# Patient Record
Sex: Male | Born: 1990 | Race: Black or African American | Hispanic: No | Marital: Single | Smoking: Former smoker
Health system: Southern US, Community
[De-identification: ages and names within clinical notes are randomized; demographics above are authoritative.]

## PROBLEM LIST (undated history)

## (undated) HISTORY — PX: WISDOM TOOTH EXTRACTION: SHX21

---

## 2012-03-18 ENCOUNTER — Encounter (HOSPITAL_BASED_OUTPATIENT_CLINIC_OR_DEPARTMENT_OTHER): Payer: Self-pay | Admitting: Emergency Medicine

## 2012-03-18 ENCOUNTER — Emergency Department (HOSPITAL_BASED_OUTPATIENT_CLINIC_OR_DEPARTMENT_OTHER)
Admission: EM | Admit: 2012-03-18 | Discharge: 2012-03-18 | Disposition: A | Discharge status: Home / Self Care | Payer: 59 | Attending: Emergency Medicine | Admitting: Emergency Medicine

## 2012-03-18 DIAGNOSIS — L272 Dermatitis due to ingested food: Secondary | ICD-10-CM | POA: Insufficient documentation

## 2012-03-18 DIAGNOSIS — F17200 Nicotine dependence, unspecified, uncomplicated: Secondary | ICD-10-CM | POA: Insufficient documentation

## 2012-03-18 DIAGNOSIS — R21 Rash and other nonspecific skin eruption: Secondary | ICD-10-CM

## 2012-03-18 MED ORDER — PREDNISONE 20 MG PO TABS: 40.0000 mg | ORAL_TABLET | Freq: Every day | ORAL | Status: DC

## 2012-03-18 MED ORDER — HYDROXYZINE HCL 25 MG PO TABS: 25.0000 mg | ORAL_TABLET | Freq: Four times a day (QID) | ORAL | Status: DC

## 2012-03-18 MED ORDER — PREDNISONE 20 MG PO TABS
40.0000 mg | ORAL_TABLET | Freq: Once | ORAL | Status: AC
  Administered 2012-03-18: 40 mg via ORAL
  Filled 2012-03-18: qty 2

## 2012-03-18 NOTE — ED Notes (Signed)
Pt /o itchy rash all over since wed. Pt states he has been taking benadryl without relief

## 2012-03-18 NOTE — ED Provider Notes (Signed)
History     CSN: [id number]  Arrival date & time 03/18/12  0306   First MD Initiated Contact with Patient 03/18/12 0321      Chief Complaint  Patient presents with  . Rash    (Consider location/radiation/quality/duration/timing/severity/associated sxs/prior treatment) HPI Comments: Patient is an otherwise healthy 21 year old male who presents with a complaint of rash which has been present for approximately 2 and half days. He notes eating a chicken and shrimp meal followed by a yogurt and shortly thereafter developed diffuse extremity and truncal papular erythematous) acute rash. This has been persistent though it improves after taking Benadryl. It is not associated with intraoral lesions, lips or tongue, difficulty swelling breathing. He denies any topical exposures, no new medications other than Benadryl which has been using for the rash.  Patient is a 21 y.o. male presenting with rash. The history is provided by the patient.  Rash     History reviewed. No pertinent past medical history.  History reviewed. No pertinent past surgical history.  No family history on file.  History  Substance Use Topics  . Smoking status: Current Some Day Smoker  . Smokeless tobacco: Not on file  . Alcohol Use: Yes      Review of Systems  Constitutional: Negative for fever.  Skin: Positive for rash.    Allergies  Review of patient's allergies indicates no known allergies.  Home Medications   Current Outpatient Rx  Name Route Sig Dispense Refill  . DIPHENHYDRAMINE HCL (SLEEP) 25 MG PO TABS Oral Take 25 mg by mouth at bedtime as needed.    Marland Kitchen HYDROXYZINE HCL 25 MG PO TABS Oral Take 1 tablet (25 mg total) by mouth every 6 (six) hours. 12 tablet 0  . PREDNISONE 20 MG PO TABS Oral Take 2 tablets (40 mg total) by mouth daily. 10 tablet 0    BP 128/79  Pulse 89  Temp 97.8 F (36.6 C) (Oral)  Resp 18  Ht 6\' 3"  (1.905 m)  Wt 175 lb (79.379 kg)  BMI 21.87 kg/m2  SpO2  100%  Physical Exam  Nursing note and vitals reviewed. Constitutional: He appears well-developed and well-nourished. No distress.  HENT:  Head: Normocephalic and atraumatic.  Mouth/Throat: Oropharynx is clear and moist. No oropharyngeal exudate.       No swelling of the intraoral cavity, no lesions on the buccal mucosa or the tongue or lips.  Eyes: Conjunctivae normal are normal. No scleral icterus.  Musculoskeletal: Normal range of motion. He exhibits no edema and no tenderness.  Neurological: He is alert. Coordination normal.  Skin: Skin is warm and dry. Rash (diffuse papular erythematous nontender rash, no petechiae, no purpura) noted. He is not diaphoretic.    ED Course  Procedures (including critical care time)  Labs Reviewed - No data to display No results found.   1. Rash       MDM  Well-appearing, allergic type rash, no signs of systemic toxicity or anaphylaxis, prednisone given, home with hydroxyzine and prednisone, followup for allergy testing as needed.        Vida Roller, MD 03/18/12 0330

## 2014-08-09 ENCOUNTER — Emergency Department (HOSPITAL_BASED_OUTPATIENT_CLINIC_OR_DEPARTMENT_OTHER): Payer: 59

## 2014-08-09 ENCOUNTER — Emergency Department (HOSPITAL_BASED_OUTPATIENT_CLINIC_OR_DEPARTMENT_OTHER)
Admission: EM | Admit: 2014-08-09 | Discharge: 2014-08-09 | Disposition: A | Discharge status: Home / Self Care | Payer: 59 | Attending: Emergency Medicine | Admitting: Emergency Medicine

## 2014-08-09 ENCOUNTER — Encounter (HOSPITAL_BASED_OUTPATIENT_CLINIC_OR_DEPARTMENT_OTHER): Payer: Self-pay | Admitting: *Deleted

## 2014-08-09 DIAGNOSIS — Z720 Tobacco use: Secondary | ICD-10-CM | POA: Insufficient documentation

## 2014-08-09 DIAGNOSIS — Y92410 Unspecified street and highway as the place of occurrence of the external cause: Secondary | ICD-10-CM | POA: Insufficient documentation

## 2014-08-09 DIAGNOSIS — Z23 Encounter for immunization: Secondary | ICD-10-CM | POA: Insufficient documentation

## 2014-08-09 DIAGNOSIS — Z79899 Other long term (current) drug therapy: Secondary | ICD-10-CM | POA: Insufficient documentation

## 2014-08-09 DIAGNOSIS — S50311A Abrasion of right elbow, initial encounter: Secondary | ICD-10-CM | POA: Insufficient documentation

## 2014-08-09 DIAGNOSIS — S4992XA Unspecified injury of left shoulder and upper arm, initial encounter: Secondary | ICD-10-CM | POA: Insufficient documentation

## 2014-08-09 DIAGNOSIS — Y9389 Activity, other specified: Secondary | ICD-10-CM | POA: Insufficient documentation

## 2014-08-09 DIAGNOSIS — Y990 Civilian activity done for income or pay: Secondary | ICD-10-CM | POA: Insufficient documentation

## 2014-08-09 DIAGNOSIS — Z7952 Long term (current) use of systemic steroids: Secondary | ICD-10-CM | POA: Insufficient documentation

## 2014-08-09 MED ORDER — NAPROXEN 500 MG PO TABS: 500.0000 mg | ORAL_TABLET | Freq: Two times a day (BID) | ORAL | Status: DC

## 2014-08-09 MED ORDER — TETANUS-DIPHTH-ACELL PERTUSSIS 5-2.5-18.5 LF-MCG/0.5 IM SUSP
0.5000 mL | Freq: Once | INTRAMUSCULAR | Status: AC
  Administered 2014-08-09: 0.5 mL via INTRAMUSCULAR
  Filled 2014-08-09: qty 0.5

## 2014-08-09 MED ORDER — METHOCARBAMOL 500 MG PO TABS: 500.0000 mg | ORAL_TABLET | Freq: Two times a day (BID) | ORAL | Status: DC
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
## 2015-10-06 ENCOUNTER — Emergency Department (HOSPITAL_BASED_OUTPATIENT_CLINIC_OR_DEPARTMENT_OTHER)
Admission: EM | Admit: 2015-10-06 | Discharge: 2015-10-06 | Disposition: A | Discharge status: Home / Self Care | Payer: 59 | Attending: Emergency Medicine | Admitting: Emergency Medicine

## 2015-10-06 ENCOUNTER — Encounter (HOSPITAL_BASED_OUTPATIENT_CLINIC_OR_DEPARTMENT_OTHER): Payer: Self-pay | Admitting: *Deleted

## 2015-10-06 DIAGNOSIS — F17200 Nicotine dependence, unspecified, uncomplicated: Secondary | ICD-10-CM | POA: Insufficient documentation

## 2015-10-06 DIAGNOSIS — H9201 Otalgia, right ear: Secondary | ICD-10-CM | POA: Insufficient documentation

## 2015-10-06 MED ORDER — METHYLPREDNISOLONE 4 MG PO TBPK: ORAL_TABLET | ORAL | Status: DC

## 2015-10-06 MED ORDER — FLUTICASONE PROPIONATE 50 MCG/ACT NA SUSP: 1.0000 | Freq: Every day | NASAL | Status: DC

## 2015-10-06 MED FILL — SM ALLERGY RELIEF 50 MCG SP: 50 MCG | 30 days supply | Qty: 16 | Fill #0

## 2015-10-06 MED FILL — METHYLPREDNISOLONE 4 MG TAB: 4 | 6 days supply | Qty: 21 | Fill #0

## 2015-10-06 NOTE — ED Notes (Signed)
Pt amb to room 8 with quick steady gait in nad. Pt reports right ear pain x Friday, with intermittent pain in left ear also. Denies cough, sore throat or any other c/o.

## 2015-10-06 NOTE — ED Provider Notes (Signed)
CSN: [id number]     Arrival date & time 10/06/15  0757 History   First MD Initiated Contact with Patient 10/06/15 [phone]     Chief Complaint  Patient presents with  . Otalgia     (Consider location/radiation/quality/duration/timing/severity/associated sxs/prior Treatment) HPI Comments: R ear pain since Friday - throbbing, worse with wind, no pain with touching / palpation. No associated fevers. No URI sx / no itching / runny nose / sore throat.  No cough. Sx are mild, persistent, no change in hearing  Patient is a 25 y.o. male presenting with ear pain. The history is provided by the patient.  Otalgia Associated symptoms: no cough and no fever     History reviewed. No pertinent past medical history. History reviewed. No pertinent past surgical history. History reviewed. No pertinent family history. Social History  Substance Use Topics  . Smoking status: Current Some Day Smoker  . Smokeless tobacco: None  . Alcohol Use: Yes    Review of Systems  Constitutional: Negative for fever.  HENT: Positive for ear pain.   Respiratory: Negative for cough and shortness of breath.       Allergies  Review of patient's allergies indicates no known allergies.  Home Medications   Prior to Admission medications   Medication Sig Start Date End Date Taking? Authorizing Provider  fluticasone (FLONASE) 50 MCG/ACT nasal spray Place 1 spray into both nostrils daily. 10/06/15   Eber HongBrian Tae Vonada, MD  methylPREDNISolone (MEDROL DOSEPAK) 4 MG TBPK tablet 6 day taper 10/06/15   Eber HongBrian Lugenia Assefa, MD   There were no vitals taken for this visit. Physical Exam  Constitutional: He appears well-developed and well-nourished.  HENT:  Head: Normocephalic and atraumatic.  R ear - no ttp over tragus / manipulation of auricle.  TM on R with minimal erythema - light reflex present.  No effusion.  No Sinus ttp - nares patent.    No rash in the EAC.  No ttp over the mastoid.  Small spot of exudate on the left tonsil   Eyes: Conjunctivae are normal. Right eye exhibits no discharge. Left eye exhibits no discharge.  Pulmonary/Chest: Effort normal. No respiratory distress.  Lymphadenopathy:    He has no cervical adenopathy.  Neurological: He is alert. Coordination normal.  Skin: Skin is warm and dry. No rash noted. He is not diaphoretic. No erythema.  Psychiatric: He has a normal mood and affect.  Nursing note and vitals reviewed.   ED Course  Procedures (including critical care time) Labs Review Labs Reviewed - No data to display  Imaging Review No results found. I have personally reviewed and evaluated these images and lab results as part of my medical decision-making.    MDM   Final diagnoses:  Otalgia, right    Otalgia NOS   The patient has no signs of upper respiratory infection, he does not a sore throat or lymphadenopathy or fever, he may have some eustachian tube dysfunction secondary to possible early URI however the patient is totally stable appearing and can go home with steroid pack, Flonase, supportive anti-inflammatories, he is in agreement with the plan.  Eber HongBrian Alli Jasmer, MD 10/06/15 [phone]

## 2015-10-06 NOTE — Discharge Instructions (Signed)

## 2017-02-22 ENCOUNTER — Encounter (HOSPITAL_BASED_OUTPATIENT_CLINIC_OR_DEPARTMENT_OTHER): Payer: Self-pay | Admitting: *Deleted

## 2017-02-22 ENCOUNTER — Emergency Department (HOSPITAL_BASED_OUTPATIENT_CLINIC_OR_DEPARTMENT_OTHER)
Admission: EM | Admit: 2017-02-22 | Discharge: 2017-02-22 | Disposition: A | Discharge status: Home / Self Care | Payer: Managed Care, Other (non HMO) | Attending: Emergency Medicine | Admitting: Emergency Medicine

## 2017-02-22 DIAGNOSIS — Y939 Activity, unspecified: Secondary | ICD-10-CM | POA: Insufficient documentation

## 2017-02-22 DIAGNOSIS — Y929 Unspecified place or not applicable: Secondary | ICD-10-CM | POA: Insufficient documentation

## 2017-02-22 DIAGNOSIS — S39012A Strain of muscle, fascia and tendon of lower back, initial encounter: Secondary | ICD-10-CM | POA: Diagnosis not present

## 2017-02-22 DIAGNOSIS — X509XXA Other and unspecified overexertion or strenuous movements or postures, initial encounter: Secondary | ICD-10-CM | POA: Diagnosis not present

## 2017-02-22 DIAGNOSIS — Y999 Unspecified external cause status: Secondary | ICD-10-CM | POA: Diagnosis not present

## 2017-02-22 DIAGNOSIS — M545 Low back pain: Secondary | ICD-10-CM | POA: Diagnosis present

## 2017-02-22 MED ORDER — IBUPROFEN 800 MG PO TABS: 800.0000 mg | ORAL_TABLET | Freq: Three times a day (TID) | ORAL | 0 refills | Status: AC | PRN

## 2017-02-22 MED FILL — IBUPROFEN 800 MG TABS: 800 | 7 days supply | Qty: 21 | Fill #0

## 2017-02-22 NOTE — Discharge Instructions (Signed)
You have been seen in the Emergency Department (ED)  today for back pain.  Your workup and exam have not shown any acute abnormalities and you are likely suffering from muscle strain or possible problems with your discs, but there is no treatment that will fix your symptoms at this time.  Please take Motrin (ibuprofen) as needed for your pain according to the instructions written on the box.    Please follow up with your doctor as soon as possible regarding today's ED visit and your back pain.  Return to the ED for worsening back pain, fever, weakness or numbness of either leg, or if you develop either (1) an inability to urinate or have bowel movements, or (2) loss of your ability to control your bathroom functions (if you start having "accidents"), or if you develop other new symptoms that concern you.  

## 2017-02-22 NOTE — ED Notes (Signed)
Pt given work note and directed to pharmacy to pick up Rx

## 2017-02-22 NOTE — ED Triage Notes (Signed)
Pt reports low back pain x 2-3 days. Works for [employer]. 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

## 2017-02-22 NOTE — ED Provider Notes (Signed)
Emergency Department Provider Note   I have reviewed the triage vital signs and the nursing notes.   HISTORY  Chief Complaint Back Pain   HPI Tristan Washington is a 26 y.o. male presents to the emergency room in for evaluation of lower back discomfort in the setting of working more at his job. Patient states he's been doing over time and lifting heavy boxes at work. He is had more mild episodes of similar pain in the past. No fevers or chills. No numbness or tingling in the legs. No midline back pain. No bowel or bladder incontinence. No symptoms of urinary retention. No injury to the back. He has not taken any over-the-counter medications today. No radiation of symptoms. Pain worse with movement. Currently rated at mild to moderate.    History reviewed. No pertinent past medical history.  There are no active problems to display for this patient.   Past Surgical History:  Procedure Laterality Date  . WISDOM TOOTH EXTRACTION      Current Outpatient Rx  . Order #: [id number] Class: Print  . Order #: [id number] Class: Print  . Order #: [id number] Class: Print    Allergies Patient has no known allergies.  No family history on file.  Social History Social History  Substance Use Topics  . Smoking status: Never Smoker  . Smokeless tobacco: Never Used  . Alcohol use Yes     Comment: social/weekends    Review of Systems  Constitutional: No fever/chills Eyes: No visual changes. ENT: No sore throat. Cardiovascular: Denies chest pain. Respiratory: Denies shortness of breath. Gastrointestinal: No abdominal pain.  No nausea, no vomiting.  No diarrhea.  No constipation. Genitourinary: Negative for dysuria. Musculoskeletal: Positive for back pain. Skin: Negative for rash. Neurological: Negative for headaches, focal weakness or numbness.  10-point ROS otherwise negative.  ____________________________________________   PHYSICAL EXAM:  VITAL SIGNS: ED Triage Vitals  Enc Vitals  Group     BP 02/22/17 0819 128/81     Pulse Rate 02/22/17 0819 83     Resp 02/22/17 0819 18     Temp 02/22/17 0819 98 F (36.7 C)     Temp Source 02/22/17 0819 Oral     SpO2 02/22/17 0819 100 %     Weight 02/22/17 0819 185 lb (83.9 kg)     Height 02/22/17 0819  (1.905 m)     Pain Score 02/22/17 0828 2    Constitutional: Alert and oriented. Well appearing and in no acute distress. Eyes: Conjunctivae are normal.  Head: Atraumatic. Nose: No congestion/rhinnorhea. Mouth/Throat: Mucous membranes are moist.  Oropharynx non-erythematous. Neck: No stridor.   Cardiovascular: Normal rate, regular rhythm. Good peripheral circulation. Grossly normal heart sounds.   Respiratory: Normal respiratory effort.  No retractions. Lungs CTAB. Gastrointestinal: Soft and nontender. No distention.  Musculoskeletal: No lower extremity tenderness nor edema. No gross deformities of extremities. No midline spine tenderness.  Neurologic:  Normal speech and language. No gross focal neurologic deficits are appreciated. Normal strength and sensation in the LEs.  Skin:  Skin is warm, dry and intact. No rash noted.  ____________________________________________   PROCEDURES  Procedure(s) performed:   Procedures  None ____________________________________________   INITIAL IMPRESSION / ASSESSMENT AND PLAN / ED COURSE  Pertinent labs & imaging results that were available during my care of the patient were reviewed by me and considered in my medical decision making (see chart for details).  Patient presents to the ED for evaluation of lower back pain. No midline spine tenderness or  evidence of spine emergency. No red flag symptoms. Most consistent with msk strain. Plan for decreased lifting at work, NSAIDs, and PCP follow up PRN. No emergent spine imaging at this time.   At this time, I do not feel there is any life-threatening condition present. I have reviewed and discussed all results (EKG, imaging,  lab, urine as appropriate), exam findings with patient. I have reviewed nursing notes and appropriate previous records.  I feel the patient is safe to be discharged home without further emergent workup. Discussed usual and customary return precautions. Patient and family (if present) verbalize understanding and are comfortable with this plan.  Patient will follow-up with their primary care provider. If they do not have a primary care provider, information for follow-up has been provided to them. All questions have been answered.    ____________________________________________  FINAL CLINICAL IMPRESSION(S) / ED DIAGNOSES  Final diagnoses:  Strain of lumbar region, initial encounter     MEDICATIONS GIVEN DURING THIS VISIT:  Medications - No data to display   NEW OUTPATIENT MEDICATIONS STARTED DURING THIS VISIT:  Discharge Medication List as of 02/22/2017  8:42 AM    START taking these medications   Details  ibuprofen (ADVIL,MOTRIN) 800 MG tablet Take 1 tablet (800 mg total) by mouth every 8 (eight) hours as needed., Starting Tue 02/22/2017, Print        Note:  This document was prepared using Dragon voice recognition software and may include unintentional dictation errors.  Alona Bene, MD Emergency Medicine    Long, Arlyss Repress, MD 02/23/17 [phone]

## 2017-03-10 ENCOUNTER — Encounter (HOSPITAL_BASED_OUTPATIENT_CLINIC_OR_DEPARTMENT_OTHER): Payer: Self-pay

## 2017-03-10 ENCOUNTER — Emergency Department (HOSPITAL_BASED_OUTPATIENT_CLINIC_OR_DEPARTMENT_OTHER): Payer: Managed Care, Other (non HMO)

## 2017-03-10 ENCOUNTER — Emergency Department (HOSPITAL_BASED_OUTPATIENT_CLINIC_OR_DEPARTMENT_OTHER)
Admission: EM | Admit: 2017-03-10 | Discharge: 2017-03-10 | Disposition: A | Discharge status: Home / Self Care | Payer: Managed Care, Other (non HMO) | Attending: Emergency Medicine | Admitting: Emergency Medicine

## 2017-03-10 DIAGNOSIS — M549 Dorsalgia, unspecified: Secondary | ICD-10-CM | POA: Insufficient documentation

## 2017-03-10 DIAGNOSIS — M545 Low back pain: Secondary | ICD-10-CM | POA: Diagnosis present

## 2017-03-10 DIAGNOSIS — Z79899 Other long term (current) drug therapy: Secondary | ICD-10-CM | POA: Insufficient documentation

## 2017-03-10 MED ORDER — METAXALONE 800 MG PO TABS: 800.0000 mg | ORAL_TABLET | Freq: Three times a day (TID) | ORAL | 0 refills | Status: DC

## 2017-03-10 MED ORDER — MELOXICAM 15 MG PO TABS: 15.0000 mg | ORAL_TABLET | Freq: Every day | ORAL | 0 refills | Status: DC

## 2017-03-10 MED ORDER — NAPROXEN 250 MG PO TABS
500.0000 mg | ORAL_TABLET | Freq: Once | ORAL | Status: AC
  Administered 2017-03-10: 500 mg via ORAL
  Filled 2017-03-10: qty 2

## 2017-03-10 MED ORDER — PREDNISONE 50 MG PO TABS
60.0000 mg | ORAL_TABLET | Freq: Once | ORAL | Status: AC
  Administered 2017-03-10: 60 mg via ORAL
  Filled 2017-03-10: qty 1

## 2017-03-10 NOTE — ED Provider Notes (Addendum)
[facility] Provider [facility]   CSN: [id number] Arrival date & time: 03/10/17  0413     History   Chief Complaint Chief Complaint  Patient presents with  . Back Pain    HPI Tristan Washington is a 26 y.o. male.  The history is provided by the patient.  Back Pain   This is a chronic problem. The current episode started more than 1 week ago. The problem occurs constantly. The problem has not changed since onset.Associated with: lifts repeatedly but low weight. The pain is present in the sacro-iliac joint and lumbar spine. The pain does not radiate. The pain is at a severity of 4/10. The pain is moderate. The symptoms are aggravated by bending, twisting and certain positions. The pain is the same all the time. Pertinent negatives include no chest pain, no fever, no numbness, no weight loss, no headaches, no abdominal pain, no abdominal swelling, no bowel incontinence, no perianal numbness, no bladder incontinence, no dysuria, no pelvic pain, no leg pain, no paresthesias, no paresis, no tingling and no weakness. He has tried NSAIDs and heat for the symptoms. The treatment provided mild relief. Risk factors: none.    History reviewed. No pertinent past medical history.  There are no active problems to display for this patient.   Past Surgical History:  Procedure Laterality Date  . WISDOM TOOTH EXTRACTION         Home Medications    Prior to Admission medications   Medication Sig Start Date End Date Taking? Authorizing Provider  fluticasone (FLONASE) 50 MCG/ACT nasal spray Place 1 spray into both nostrils daily. 10/06/15   Eber Hong, MD  ibuprofen (ADVIL,MOTRIN) 800 MG tablet Take 1 tablet (800 mg total) by mouth every 8 (eight) hours as needed. 02/22/17   LongArlyss Repress, MD  methylPREDNISolone (MEDROL DOSEPAK) 4 MG TBPK tablet 6 day taper 10/06/15   Eber Hong, MD    Family History No family history on file.  Social History Social History  Substance Use Topics  .  Smoking status: Never Smoker  . Smokeless tobacco: Never Used  . Alcohol use Yes     Comment: social/weekends     Allergies   Patient has no known allergies.   Review of Systems Review of Systems  Constitutional: Negative for fever and weight loss.  Respiratory: Negative for shortness of breath.   Cardiovascular: Negative for chest pain.  Gastrointestinal: Negative for abdominal pain and bowel incontinence.  Genitourinary: Negative for bladder incontinence, difficulty urinating, dysuria and pelvic pain.  Musculoskeletal: Positive for back pain. Negative for gait problem.  Neurological: Negative for tingling, weakness, numbness, headaches and paresthesias.  All other systems reviewed and are negative.    Physical Exam Updated Vital Signs BP 138/81 (BP Location: Right Arm)   Pulse 77   Temp 97.8 F (36.6 C) (Oral)   Resp 18   Ht  (1.905 m)   Wt 83.9 kg (185 lb)   SpO2 100%   BMI 23.12 kg/m   Physical Exam  Constitutional: He is oriented to person, place, and time. He appears well-developed and well-nourished. No distress.  HENT:  Head: Normocephalic and atraumatic.  Mouth/Throat: No oropharyngeal exudate.  Eyes: Pupils are equal, round, and reactive to light. Conjunctivae are normal.  Neck: Normal range of motion. Neck supple.  Cardiovascular: Normal rate, regular rhythm, normal heart sounds and intact distal pulses.   Pulmonary/Chest: Effort normal and breath sounds normal.  Abdominal: Soft. Bowel sounds are normal. He exhibits no  mass. There is no tenderness. There is no rebound and no guarding.  Musculoskeletal: Normal range of motion.  Neurological: He is alert and oriented to person, place, and time. He displays normal reflexes.  Skin: Skin is warm and dry. Capillary refill takes less than 2 seconds.  Psychiatric: He has a normal mood and affect.     ED Treatments / Results   Vitals:   03/10/17 0420  BP: 138/81  Pulse: 77  Resp: 18  Temp: 97.8 F  (36.6 C)  SpO2: 100%    Radiology Dg Lumbar Spine Complete  Result Date: 03/10/2017 CLINICAL DATA:  Chronic intermittent lower back pain. Initial encounter. EXAM: LUMBAR SPINE - COMPLETE 4+ VIEW COMPARISON:  None. FINDINGS: There is no evidence of fracture or subluxation. Vertebral bodies demonstrate normal height and alignment. Intervertebral disc spaces are preserved. The visualized neural foramina are grossly unremarkable in appearance. The visualized bowel gas pattern is unremarkable in appearance; air and stool are noted within the colon. The sacroiliac joints are within normal limits. IMPRESSION: No evidence of fracture or subluxation along the lumbar spine. Electronically Signed   By: Roanna Raider M.D.   On: 03/10/2017 05:19    Procedures Procedures (including critical care time)  Medications Ordered in ED Medications  predniSONE (DELTASONE) tablet 60 mg (not administered)  naproxen (NAPROSYN) tablet 500 mg (not administered)       Final Clinical Impressions(s) / ED Diagnoses   Final diagnoses:  Mechanical back pain  Will refer to Dr. Pearletha Forge, will add muscle relaxants.  Strict return precautions given for  Shortness of breath, swelling or the lips or tongue, chest pain, dyspnea on exertion, new weakness or numbness changes in vision or speech,  Inability to tolerate liquids or food, changes in voice cough, altered mental status or any concerns. No signs of systemic illness or infection. The patient is nontoxic-appearing on exam and vital signs are within normal limits.    I have reviewed the triage vital signs and the nursing notes. Pertinent labs &imaging results that were available during my care of the patient were reviewed by me and considered in my medical decision making (see chart for details).  After history, exam, and medical workup I feel the patient has been appropriately medically screened and is safe for discharge home. Pertinent diagnoses were discussed  with the patient. Patient was given return precautions.   New Prescriptions New Prescriptions   No medications on file     Leeam Cedrone, MD 03/10/17 0540    Yuma Pacella, MD 03/10/17 1610

## 2017-03-10 NOTE — ED Triage Notes (Signed)
Pt c/o chronic lower back pain, did not follow up from visit on 9/25.  Pt denies groin tingling or numbness, denies incontinence.

## 2017-03-10 NOTE — ED Notes (Signed)
Pt verbalizes understanding of d/c instructions and denies any further needs at this time. 

## 2017-04-08 ENCOUNTER — Encounter (HOSPITAL_BASED_OUTPATIENT_CLINIC_OR_DEPARTMENT_OTHER): Payer: Self-pay | Admitting: Emergency Medicine

## 2017-04-08 ENCOUNTER — Emergency Department (HOSPITAL_BASED_OUTPATIENT_CLINIC_OR_DEPARTMENT_OTHER)
Admission: EM | Admit: 2017-04-08 | Discharge: 2017-04-09 | Disposition: A | Discharge status: Home / Self Care | Payer: Managed Care, Other (non HMO) | Attending: Emergency Medicine | Admitting: Emergency Medicine

## 2017-04-08 ENCOUNTER — Other Ambulatory Visit: Payer: Self-pay

## 2017-04-08 DIAGNOSIS — Z87891 Personal history of nicotine dependence: Secondary | ICD-10-CM | POA: Diagnosis not present

## 2017-04-08 DIAGNOSIS — M79641 Pain in right hand: Secondary | ICD-10-CM | POA: Diagnosis present

## 2017-04-08 DIAGNOSIS — Z79899 Other long term (current) drug therapy: Secondary | ICD-10-CM | POA: Insufficient documentation

## 2017-04-08 NOTE — ED Triage Notes (Signed)
Pt fell onto right hand 2 weeks ago. Still painful and slightly swollen on ulnar side.

## 2017-04-09 ENCOUNTER — Encounter (HOSPITAL_BASED_OUTPATIENT_CLINIC_OR_DEPARTMENT_OTHER): Payer: Self-pay | Admitting: Emergency Medicine

## 2017-04-09 ENCOUNTER — Emergency Department (HOSPITAL_BASED_OUTPATIENT_CLINIC_OR_DEPARTMENT_OTHER): Payer: Managed Care, Other (non HMO)

## 2017-04-09 MED ORDER — NAPROXEN 250 MG PO TABS
500.0000 mg | ORAL_TABLET | Freq: Once | ORAL | Status: AC
  Administered 2017-04-09: 500 mg via ORAL
  Filled 2017-04-09: qty 2

## 2017-04-09 MED ORDER — ACETAMINOPHEN 500 MG PO TABS
1000.0000 mg | ORAL_TABLET | Freq: Once | ORAL | Status: AC
Start: 2017-04-09 — End: 2017-04-09
  Administered 2017-04-09: 1000 mg via ORAL
  Filled 2017-04-09: qty 2

## 2017-04-09 MED ORDER — NAPROXEN 375 MG PO TABS: 375.0000 mg | ORAL_TABLET | Freq: Two times a day (BID) | ORAL | 0 refills | Status: DC

## 2017-04-09 NOTE — ED Notes (Signed)
Patient transported to X-ray 

## 2017-04-09 NOTE — ED Provider Notes (Signed)
[facility] EMERGENCY DEPARTMENT Provider Note   CSN: [id number] Arrival date & time: 04/08/17  2347     History   Chief Complaint Chief Complaint  Patient presents with  . Hand Injury    HPI Tristan Washington is a 26 y.o. male.  The history is provided by the patient.  Hand Injury   The incident occurred more than 1 week ago (fell 2-3 weeks ago). The incident occurred at work. The injury mechanism was a fall. The pain is present in the right hand. The quality of the pain is described as aching. The pain is moderate. The pain has been constant since the incident. Pertinent negatives include no fever. He reports no foreign bodies present. The symptoms are aggravated by movement and palpation. He has tried NSAIDs for the symptoms. The treatment provided no relief.    History reviewed. No pertinent past medical history.  There are no active problems to display for this patient.   Past Surgical History:  Procedure Laterality Date  . WISDOM TOOTH EXTRACTION         Home Medications    Prior to Admission medications   Medication Sig Start Date End Date Taking? Authorizing Provider  fluticasone (FLONASE) 50 MCG/ACT nasal spray Place 1 spray into both nostrils daily. 10/06/15   Eber HongMiller, Brian, MD  ibuprofen (ADVIL,MOTRIN) 800 MG tablet Take 1 tablet (800 mg total) by mouth every 8 (eight) hours as needed. 02/22/17   Long, Arlyss RepressJoshua G, MD  meloxicam (MOBIC) 15 MG tablet Take 1 tablet (15 mg total) by mouth daily. 03/10/17   Tifini Reeder, MD  metaxalone (SKELAXIN) 800 MG tablet Take 1 tablet (800 mg total) by mouth 3 (three) times daily. 03/10/17   Nitesh Pitstick, MD  methylPREDNISolone (MEDROL DOSEPAK) 4 MG TBPK tablet 6 day taper 10/06/15   Eber HongMiller, Brian, MD    Family History No family history on file.  Social History Social History   Tobacco Use  . Smoking status: Former Games developermoker  . Smokeless tobacco: Never Used  Substance Use Topics  . Alcohol use: Yes    Comment:  social/weekends  . Drug use: Yes    Types: Marijuana     Allergies   Patient has no known allergies.   Review of Systems Review of Systems  Constitutional: Negative for fever.  Musculoskeletal: Positive for arthralgias. Negative for gait problem, joint swelling and neck pain.  All other systems reviewed and are negative.    Physical Exam Updated Vital Signs BP 132/85 (BP Location: Right Arm)   Pulse 98   Temp 97.6 F (36.4 C) (Oral)   Resp 16   Ht 6\' 3"  (1.905 m)   Wt 83.9 kg (185 lb)   SpO2 100%   BMI 23.12 kg/m   Physical Exam  Constitutional: He is oriented to person, place, and time. He appears well-developed and well-nourished. No distress.  HENT:  Head: Normocephalic and atraumatic.  Mouth/Throat: No oropharyngeal exudate.  Eyes: Conjunctivae are normal. Pupils are equal, round, and reactive to light.  Neck: Normal range of motion. Neck supple.  Cardiovascular: Normal rate, regular rhythm, normal heart sounds and intact distal pulses.  Pulmonary/Chest: Effort normal and breath sounds normal. No stridor. He has no wheezes. He has no rales.  Abdominal: Soft. Bowel sounds are normal. He exhibits no mass. There is no tenderness. There is no rebound and no guarding.  Musculoskeletal: Normal range of motion. He exhibits no edema or deformity.       Right elbow: Normal.  Right wrist: Normal.       Right forearm: Normal.       Right hand: Normal. He exhibits normal range of motion, no bony tenderness, normal two-point discrimination, normal capillary refill, no deformity and no swelling. Normal sensation noted. Normal strength noted.  Neurological: He is alert and oriented to person, place, and time. He displays normal reflexes.  Skin: Skin is warm and dry. Capillary refill takes less than 2 seconds.  Nursing note and vitals reviewed.    ED Treatments / Results   Vitals:   04/08/17 2353  BP: 132/85  Pulse: 98  Resp: 16  Temp: 97.6 F (36.4 C)  SpO2: 100%     Radiology No results found.  Procedures Procedures (including critical care time)  Medications Ordered in ED Medications  naproxen (NAPROSYN) tablet 500 mg (500 mg Oral Given 04/09/17 0039)  acetaminophen (TYLENOL) tablet 1,000 mg (1,000 mg Oral Given 04/09/17 0039)      Final Clinical Impressions(s) / ED Diagnoses  All questions answered to the patient's satisfaction.   Strict return precautions for swelling or the lips or tongue, chest pain, dyspnea on exertion, new weakness or numbness changes in vision or speech, fevers, weakness persistent pain, Inability to tolerate liquids or food, changes in voice cough, altered mental status or any concerns. No signs of systemic illness or infection. The patient is nontoxic-appearing on exam and vital signs are within normal limits.    I have reviewed the triage vital signs and the nursing notes. Pertinent labs &imaging results that were available during my care of the patient were reviewed by me and considered in my medical decision making (see chart for details).  After history, exam, and medical workup I feel the patient has been appropriately medically screened and is safe for discharge home. Pertinent diagnoses were discussed with the patient. Patient was given return precautions.   Param Capri, MD 04/09/17 [id number]

## 2017-11-29 ENCOUNTER — Emergency Department (HOSPITAL_BASED_OUTPATIENT_CLINIC_OR_DEPARTMENT_OTHER)
Admission: EM | Admit: 2017-11-29 | Discharge: 2017-11-30 | Disposition: A | Discharge status: Home / Self Care | Payer: Managed Care, Other (non HMO) | Attending: Emergency Medicine | Admitting: Emergency Medicine

## 2017-11-29 ENCOUNTER — Encounter (HOSPITAL_BASED_OUTPATIENT_CLINIC_OR_DEPARTMENT_OTHER): Payer: Self-pay | Admitting: Emergency Medicine

## 2017-11-29 ENCOUNTER — Other Ambulatory Visit: Payer: Self-pay

## 2017-11-29 DIAGNOSIS — F1210 Cannabis abuse, uncomplicated: Secondary | ICD-10-CM | POA: Insufficient documentation

## 2017-11-29 DIAGNOSIS — Z87891 Personal history of nicotine dependence: Secondary | ICD-10-CM | POA: Insufficient documentation

## 2017-11-29 DIAGNOSIS — R197 Diarrhea, unspecified: Secondary | ICD-10-CM | POA: Diagnosis not present

## 2017-11-29 DIAGNOSIS — R112 Nausea with vomiting, unspecified: Secondary | ICD-10-CM | POA: Diagnosis not present

## 2017-11-29 DIAGNOSIS — Z79899 Other long term (current) drug therapy: Secondary | ICD-10-CM | POA: Diagnosis not present

## 2017-11-29 NOTE — ED Triage Notes (Signed)
Pt is c/o vomiting that started earlier today   Pt denies pain  States he has had a little diarrhea

## 2017-11-30 ENCOUNTER — Encounter (HOSPITAL_BASED_OUTPATIENT_CLINIC_OR_DEPARTMENT_OTHER): Payer: Self-pay | Admitting: Emergency Medicine

## 2017-11-30 MED ORDER — ONDANSETRON 8 MG PO TBDP
ORAL_TABLET | ORAL | Status: AC
  Filled 2017-11-30: qty 1

## 2017-11-30 MED ORDER — ONDANSETRON 8 MG PO TBDP
8.0000 mg | ORAL_TABLET | Freq: Once | ORAL | Status: AC
  Administered 2017-11-30: 8 mg via ORAL

## 2017-11-30 NOTE — ED Provider Notes (Signed)
[facility] EMERGENCY DEPARTMENT Provider Note   CSN: [id number] Arrival date & time: 11/29/17  2253     History   Chief Complaint Chief Complaint  Patient presents with  . Emesis    HPI Tristan Washington is a 27 y.o. male.  The history is provided by the patient.  Emesis   This is a new problem. The current episode started 12 to 24 hours ago. The problem occurs 2 to 4 times per day. The problem has not changed since onset.The emesis has an appearance of stomach contents. There has been no fever. Associated symptoms include diarrhea. Pertinent negatives include no abdominal pain, no fever, no myalgias and no URI. Risk factors include ill contacts.  Coworkers with same, 3 episodes of emesis 2 diarrhea.  Nothing since 7 pm.    History reviewed. No pertinent past medical history.  There are no active problems to display for this patient.   Past Surgical History:  Procedure Laterality Date  . WISDOM TOOTH EXTRACTION          Home Medications    Prior to Admission medications   Medication Sig Start Date End Date Taking? Authorizing Provider  fluticasone (FLONASE) 50 MCG/ACT nasal spray Place 1 spray into both nostrils daily. 10/06/15   Eber Hong, MD  ibuprofen (ADVIL,MOTRIN) 800 MG tablet Take 1 tablet (800 mg total) by mouth every 8 (eight) hours as needed. 02/22/17   Long, Arlyss Repress, MD  meloxicam (MOBIC) 15 MG tablet Take 1 tablet (15 mg total) by mouth daily. 03/10/17   Samanatha Brammer, MD  metaxalone (SKELAXIN) 800 MG tablet Take 1 tablet (800 mg total) by mouth 3 (three) times daily. 03/10/17   Valine Drozdowski, MD  methylPREDNISolone (MEDROL DOSEPAK) 4 MG TBPK tablet 6 day taper 10/06/15   Eber Hong, MD  naproxen (NAPROSYN) 375 MG tablet Take 1 tablet (375 mg total) 2 (two) times daily by mouth. 04/09/17   Conlee Sliter, MD    Family History Family History  Problem Relation Age of Onset  . Hypertension Other     Social History Social History   Tobacco  Use  . Smoking status: Former Games developer  . Smokeless tobacco: Never Used  Substance Use Topics  . Alcohol use: Yes    Comment: social/weekends  . Drug use: Yes    Types: Marijuana     Allergies   Patient has no known allergies.   Review of Systems Review of Systems  Constitutional: Negative for diaphoresis and fever.  Respiratory: Negative for shortness of breath.   Cardiovascular: Negative for chest pain.  Gastrointestinal: Positive for diarrhea, nausea and vomiting. Negative for abdominal pain.  Musculoskeletal: Negative for myalgias.  All other systems reviewed and are negative.    Physical Exam Updated Vital Signs BP 117/81 (BP Location: Left Arm)   Pulse 90   Temp 98.3 F (36.8 C) (Oral)   Resp 20   SpO2 100%   Physical Exam  Constitutional: He is oriented to person, place, and time. He appears well-developed and well-nourished. No distress.  HENT:  Head: Normocephalic and atraumatic.  Mouth/Throat: No oropharyngeal exudate.  Eyes: Pupils are equal, round, and reactive to light. Conjunctivae are normal.  Neck: Normal range of motion. Neck supple.  Cardiovascular: Normal rate, regular rhythm, normal heart sounds and intact distal pulses.  Pulmonary/Chest: Effort normal and breath sounds normal. No respiratory distress.  Abdominal: Soft. Bowel sounds are normal. He exhibits no distension and no mass. There is no tenderness. There is no  rebound and no guarding. No hernia.  Musculoskeletal: Normal range of motion.  Neurological: He is alert and oriented to person, place, and time.  Skin: Skin is warm and dry. Capillary refill takes less than 2 seconds.  Psychiatric: He has a normal mood and affect.  Nursing note and vitals reviewed.    ED Treatments / Results  Labs (all labs ordered are listed, but only abnormal results are displayed) Labs Reviewed - No data to display  EKG None  Radiology No results found.  Procedures Procedures (including critical care  time)  Medications Ordered in ED Medications  ondansetron (ZOFRAN-ODT) disintegrating tablet 8 mg (8 mg Oral Given 11/30/17 0001)      Final Clinical Impressions(s) / ED Diagnoses   Final diagnoses:  Nausea vomiting and diarrhea    Well appearing exam and vitals are benign and reassuring.    Return for pain, numbness, changes in vision or speech, fevers >100.4 unrelieved by medication, shortness of breath, intractable vomiting, or diarrhea, abdominal pain, Inability to tolerate liquids or food, cough, altered mental status or any concerns. No signs of systemic illness or infection. The patient is nontoxic-appearing on exam and vital signs are within normal limits. Will refer to urology for microscopy hematuria as patient is asymptomatic.  I have reviewed the triage vital signs and the nursing notes. Pertinent labs &imaging results that were available during my care of the patient were reviewed by me and considered in my medical decision making (see chart for details).  After history, exam, and medical workup I feel the patient has been appropriately medically screened and is safe for discharge home. Pertinent diagnoses were discussed with the patient. Patient was given return precautions.   Lycan Davee, MD 11/30/17 [phone]

## 2017-12-13 ENCOUNTER — Encounter (HOSPITAL_BASED_OUTPATIENT_CLINIC_OR_DEPARTMENT_OTHER): Payer: Self-pay | Admitting: *Deleted

## 2017-12-13 ENCOUNTER — Other Ambulatory Visit: Payer: Self-pay

## 2017-12-13 ENCOUNTER — Emergency Department (HOSPITAL_BASED_OUTPATIENT_CLINIC_OR_DEPARTMENT_OTHER)
Admission: EM | Admit: 2017-12-13 | Discharge: 2017-12-13 | Disposition: A | Discharge status: Home / Self Care | Payer: Managed Care, Other (non HMO) | Attending: Emergency Medicine | Admitting: Emergency Medicine

## 2017-12-13 DIAGNOSIS — Z87891 Personal history of nicotine dependence: Secondary | ICD-10-CM | POA: Diagnosis not present

## 2017-12-13 DIAGNOSIS — Z79899 Other long term (current) drug therapy: Secondary | ICD-10-CM | POA: Diagnosis not present

## 2017-12-13 DIAGNOSIS — H9202 Otalgia, left ear: Secondary | ICD-10-CM | POA: Insufficient documentation

## 2017-12-13 MED ORDER — NEOMYCIN-POLYMYXIN-HC 3.5-10000-1 OT SUSP: 4.0000 [drp] | Freq: Four times a day (QID) | OTIC | 0 refills | Status: AC

## 2017-12-13 NOTE — Discharge Instructions (Signed)
Apply the eardrops to the affected ear 4 times a day for 7 days.  Antiinflammatory medications: Take 600 mg of ibuprofen every 6 hours or 440 mg (over the counter dose) to 500 mg (prescription dose) of naproxen every 12 hours for the next 3 days. After this time, these medications may be used as needed for pain. Take these medications with food to avoid upset stomach. Choose only one of these medications, do not take them together. Tylenol: Should you continue to have additional pain while taking the ibuprofen or naproxen, you may add in tylenol as needed. Your daily total maximum amount of tylenol from all sources should be limited to 4000mg /day for persons without liver problems, or 2000mg /day for those with liver problems.  Follow-up with a primary care provider for any further management of this issue.

## 2017-12-13 NOTE — ED Triage Notes (Signed)
Left ear pain x 2 days

## 2017-12-13 NOTE — ED Provider Notes (Signed)
[facility] EMERGENCY DEPARTMENT Provider Note   CSN: [id number] Arrival date & time: 12/13/17  1942     History   Chief Complaint Chief Complaint  Patient presents with  . Otalgia    HPI Tristan Washington is a 27 y.o. male.  HPI   Tristan Washington is a 27 y.o. male, patient with no pertinent past medical history, presenting to the ED with left ear pain for the past few days.  Pain is aching, moderate, nonradiating.  Denies fever, ear drainage, headache, dental pain, sore throat, dizziness, hearing impairment, or any other complaints.      History reviewed. No pertinent past medical history.  There are no active problems to display for this patient.   Past Surgical History:  Procedure Laterality Date  . WISDOM TOOTH EXTRACTION          Home Medications    Prior to Admission medications   Medication Sig Start Date End Date Taking? Authorizing Provider  fluticasone (FLONASE) 50 MCG/ACT nasal spray Place 1 spray into both nostrils daily. 10/06/15   Eber HongMiller, Brian, MD  ibuprofen (ADVIL,MOTRIN) 800 MG tablet Take 1 tablet (800 mg total) by mouth every 8 (eight) hours as needed. 02/22/17   Long, Arlyss RepressJoshua G, MD  meloxicam (MOBIC) 15 MG tablet Take 1 tablet (15 mg total) by mouth daily. 03/10/17   Palumbo, April, MD  metaxalone (SKELAXIN) 800 MG tablet Take 1 tablet (800 mg total) by mouth 3 (three) times daily. 03/10/17   Palumbo, April, MD  methylPREDNISolone (MEDROL DOSEPAK) 4 MG TBPK tablet 6 day taper 10/06/15   Eber HongMiller, Brian, MD  naproxen (NAPROSYN) 375 MG tablet Take 1 tablet (375 mg total) 2 (two) times daily by mouth. 04/09/17   Palumbo, April, MD  neomycin-polymyxin-hydrocortisone (CORTISPORIN) 3.5-10000-1 OTIC suspension Place 4 drops into the left ear 4 (four) times daily for 7 days. 12/13/17 12/20/17  Anselm PancoastJoy, Ry Moody C, PA-C    Family History Family History  Problem Relation Age of Onset  . Hypertension Other     Social History Social History   Tobacco Use  .  Smoking status: Former Games developermoker  . Smokeless tobacco: Never Used  Substance Use Topics  . Alcohol use: Yes    Comment: social/weekends  . Drug use: Yes    Types: Marijuana     Allergies   Patient has no known allergies.   Review of Systems Review of Systems  Constitutional: Negative for chills and fever.  HENT: Positive for ear pain. Negative for congestion, dental problem, ear discharge and sore throat.   Gastrointestinal: Negative for nausea and vomiting.  Neurological: Negative for dizziness and headaches.     Physical Exam Updated Vital Signs BP 115/81   Pulse 87   Temp 98.4 F (36.9 C) (Oral)   Resp 16   Ht 6\' 3"  (1.905 m)   Wt 83.9 kg (185 lb)   SpO2 100%   BMI 23.12 kg/m   Physical Exam  Constitutional: He appears well-developed and well-nourished. No distress.  HENT:  Head: Normocephalic and atraumatic.  Right Ear: Tympanic membrane, external ear and ear canal normal.  Left Ear: Tympanic membrane and external ear normal.  Mouth/Throat: Oropharynx is clear and moist.  Erythematous canal  Eyes: Conjunctivae are normal.  Neck: Neck supple.  Cardiovascular: Normal rate and regular rhythm.  Pulmonary/Chest: Effort normal.  Neurological: He is alert.  Skin: Skin is warm and dry. He is not diaphoretic. No pallor.  Psychiatric: He has a normal mood and affect. His behavior  is normal.  Nursing note and vitals reviewed.    ED Treatments / Results  Labs (all labs ordered are listed, but only abnormal results are displayed) Labs Reviewed - No data to display  EKG None  Radiology No results found.  Procedures Procedures (including critical care time)  Medications Ordered in ED Medications - No data to display   Initial Impression / Assessment and Plan / ED Course  I have reviewed the triage vital signs and the nursing notes.  Pertinent labs & imaging results that were available during my care of the patient were reviewed by me and considered in my  medical decision making (see chart for details).     Patient presents with left ear pain.  Erythematous canal.  No drainage. The patient was given instructions for home care as well as return precautions. Patient voices understanding of these instructions, accepts the plan, and is comfortable with discharge.   Final Clinical Impressions(s) / ED Diagnoses   Final diagnoses:  Left ear pain    ED Discharge Orders        Ordered    neomycin-polymyxin-hydrocortisone (CORTISPORIN) 3.5-10000-1 OTIC suspension  4 times daily     12/13/17 2011       Concepcion Living 12/13/17 2012    Tegeler, Canary Brim, MD 12/13/17 2312

## 2019-04-27 IMAGING — CR DG HAND COMPLETE 3+V*R*
3 series · 3 of 3 positions shown · non-contrast
Comparison: None.

CLINICAL DATA: Fall 3 weeks ago

EXAM:
RIGHT HAND - COMPLETE 3+ VIEW

[x hand pa right]
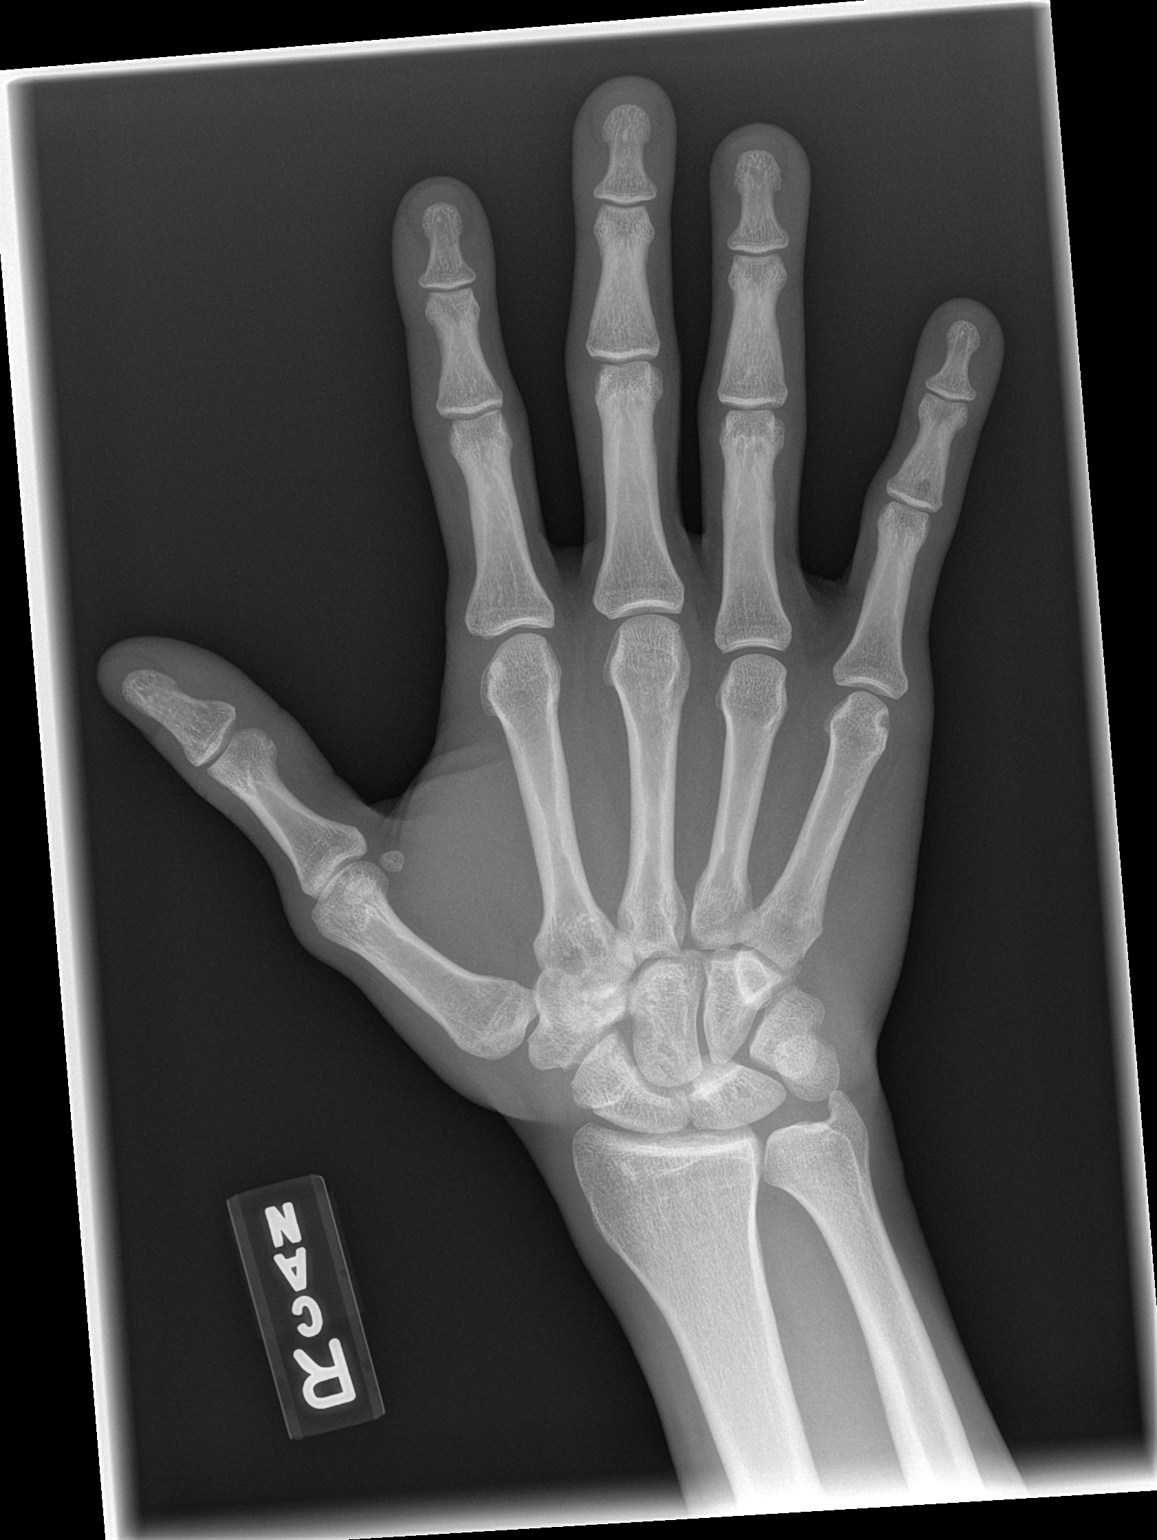

[x hand oblique right]
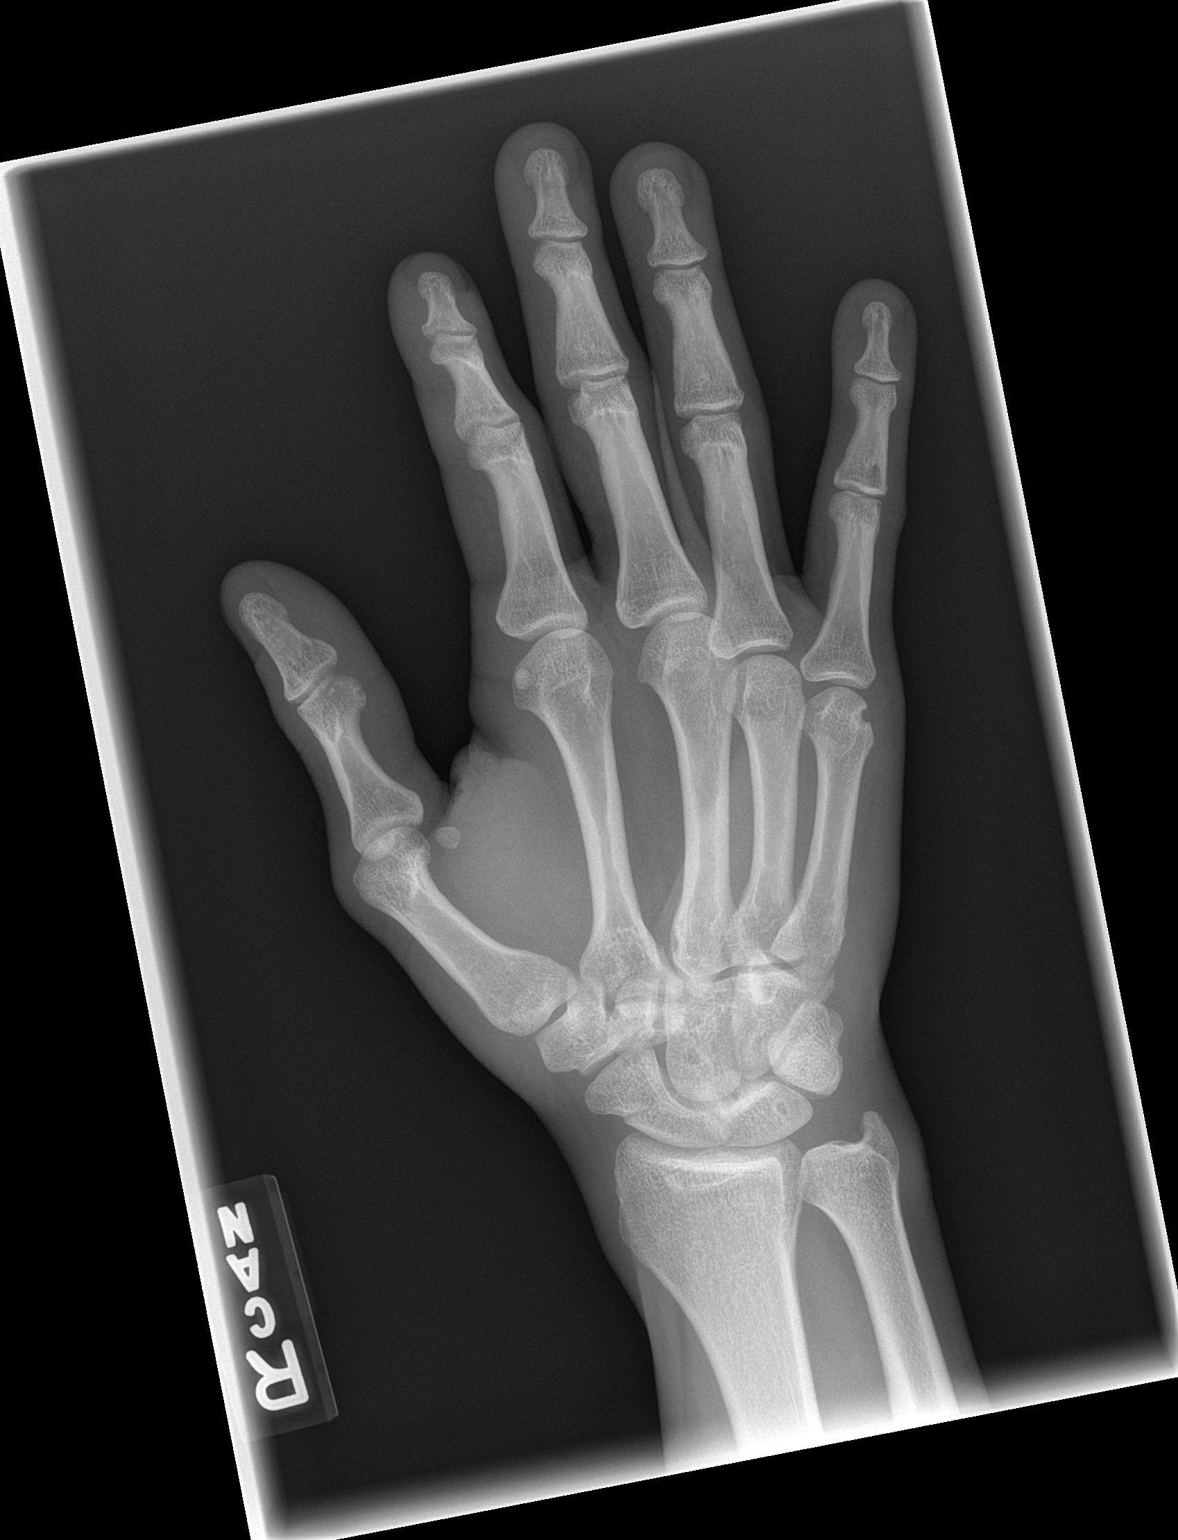

[x hand lat right]
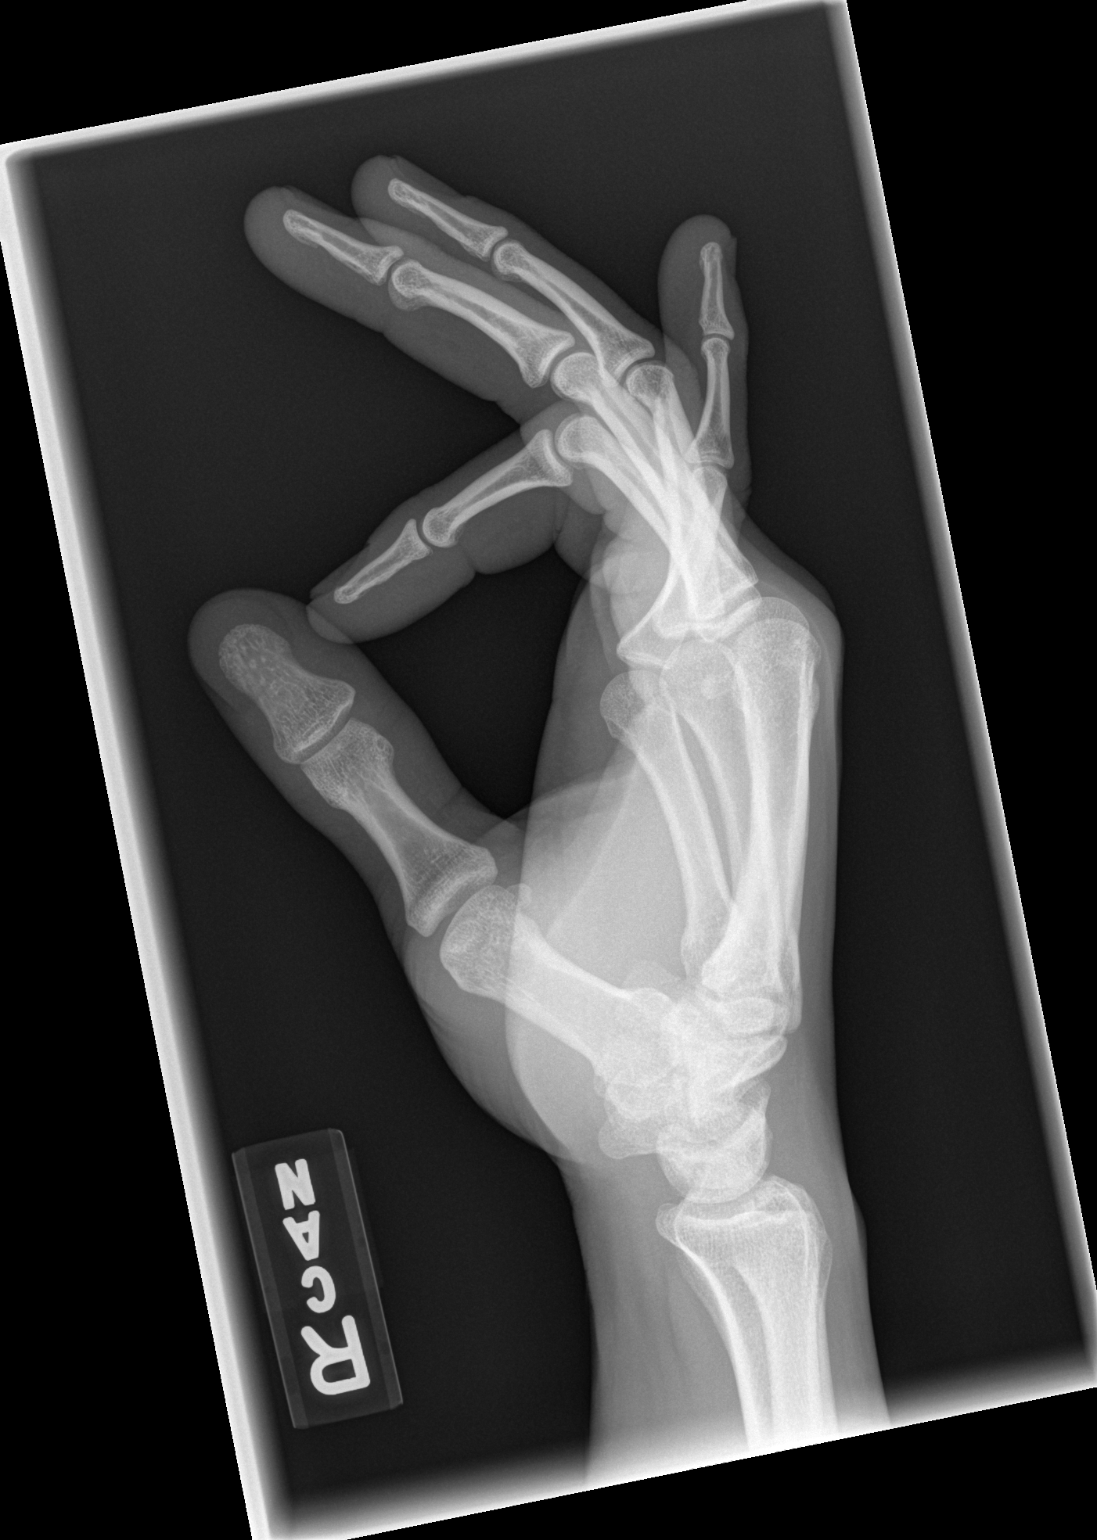

[3 of 3 positions shown; findings below may reference images not displayed]

FINDINGS: No fracture or malalignment. No radiopaque foreign body in the soft
tissues. A mild defect at the head of the fifth metacarpal on
obliques view does not have the appearance of a fracture.
IMPRESSION: No definite acute osseous abnormality.

## 2021-08-20 ENCOUNTER — Encounter: Payer: Self-pay | Admitting: Emergency Medicine

## 2021-08-20 ENCOUNTER — Emergency Department (INDEPENDENT_AMBULATORY_CARE_PROVIDER_SITE_OTHER)
Admission: EM | Admit: 2021-08-20 | Discharge: 2021-08-20 | Disposition: A | Discharge status: Home / Self Care | Payer: BC Managed Care – PPO | Source: Home / Self Care | Attending: Family Medicine | Admitting: Family Medicine

## 2021-08-20 ENCOUNTER — Other Ambulatory Visit: Payer: Self-pay

## 2021-08-20 DIAGNOSIS — M542 Cervicalgia: Secondary | ICD-10-CM | POA: Diagnosis not present

## 2021-08-20 MED ORDER — CYCLOBENZAPRINE HCL 10 MG PO TABS: 10.0000 mg | ORAL_TABLET | Freq: Two times a day (BID) | ORAL | 0 refills | Status: AC | PRN

## 2021-08-20 MED ORDER — MELOXICAM 15 MG PO TABS: 15.0000 mg | ORAL_TABLET | Freq: Every day | ORAL | 0 refills | Status: AC

## 2021-08-20 NOTE — Discharge Instructions (Signed)
Use ice or heat to painful neck muscles ?Take meloxicam once a day.  This is an anti-inflammatory pain medicine.  Do not take ibuprofen while on meloxicam ?Take Flexeril as needed as muscle relaxer.  This is useful at bedtime.  It may cause drowsiness during the day.  You may take 2 or 3 times a day ?Gentle stretching as advised.  Avoid heavy lifting. ?Return if not improving in a few days, here or to a sports medicine provider ?

## 2021-08-20 NOTE — ED Provider Notes (Signed)
?[facility] ? ? ? ?CSN: [id number] ?Arrival date & time: 08/20/21  0932 ? ? ?  ? ?History   ?Chief Complaint ?Chief Complaint  ?Patient presents with  ? Shoulder Pain  ? ? ?HPI ?Tristan Washington is a 31 y.o. male.  ? ?HPI ? ?Patient works as a Administrator, [employer].  He does a lot of lifting pushing and pulling.  In addition he likes to workout in the gym.  Between these 2 activities he has pulled a muscle in his neck.  He points to his left upper body trapezius.  He has pain with neck movement.  Full arm movement, no shoulder pain.  No numbness or weakness into the fingers.  Not improving with over-the-counter ibuprofen.  He is having difficulty completing his job secondary to the pain.  He states he stopped working out in the gym because of the pain, but it is still not improved ? ?History reviewed. No pertinent past medical history. ? ?There are no problems to display for this patient. ? ? ?Past Surgical History:  ?Procedure Laterality Date  ? WISDOM TOOTH EXTRACTION    ? ? ? ? ? ?Home Medications   ? ?Prior to Admission medications   ?Medication Sig Start Date End Date Taking? Authorizing Provider  ?cyclobenzaprine (FLEXERIL) 10 MG tablet Take 1 tablet (10 mg total) by mouth 2 (two) times daily as needed for muscle spasms. 08/20/21  Yes Eustace Moore, MD  ?meloxicam (MOBIC) 15 MG tablet Take 1 tablet (15 mg total) by mouth daily. 08/20/21  Yes Eustace Moore, MD  ?ibuprofen (ADVIL,MOTRIN) 800 MG tablet Take 1 tablet (800 mg total) by mouth every 8 (eight) hours as needed. 02/22/17   Long, Arlyss Repress, MD  ? ? ?Family History ?Family History  ?Problem Relation Age of Onset  ? Hypertension Other   ? ? ?Social History ?Social History  ? ?Tobacco Use  ? Smoking status: Former  ? Smokeless tobacco: Never  ?Vaping Use  ? Vaping Use: Never used  ?Substance Use Topics  ? Alcohol use: Yes  ?  Comment: social/weekends  ? Drug use: Yes  ?  Types: Marijuana  ? ? ? ?Allergies   ?Patient has no known  allergies. ? ? ?Review of Systems ?Review of Systems ?See HPI ? ?Physical Exam ?Triage Vital Signs ?ED Triage Vitals  ?Enc Vitals Group  ?   BP 08/20/21 0950 111/72  ?   Pulse Rate 08/20/21 0950 85  ?   Resp 08/20/21 0950 16  ?   Temp 08/20/21 0950 97.9 ?F (36.6 ?C)  ?   Temp Source 08/20/21 0950 Oral  ?   SpO2 08/20/21 0950 99 %  ?   Weight --   ?   Height --   ?   Head Circumference --   ?   Peak Flow --   ?   Pain Score 08/20/21 0952 5  ?   Pain Loc --   ?   Pain Edu? --   ?   Excl. in GC? --   ? ?No data found. ? ?Updated Vital Signs ?BP 111/72 (BP Location: Right Arm)   Pulse 85   Temp 97.9 ?F (36.6 ?C) (Oral)   Resp 16   SpO2 99%  ? ?   ? ?Physical Exam ?Constitutional:   ?   General: He is not in acute distress. ?   Appearance: He is well-developed.  ?HENT:  ?   Head: Normocephalic and atraumatic.  ?Eyes:  ?  Conjunctiva/sclera: Conjunctivae normal.  ?   Pupils: Pupils are equal, round, and reactive to light.  ?Neck:  ?   Comments: Tenderness and tightness in the left upper body of the trapezius muscle to the lower cervical spine.  Slow but full range of motion ?Cardiovascular:  ?   Rate and Rhythm: Normal rate.  ?Pulmonary:  ?   Effort: Pulmonary effort is normal. No respiratory distress.  ?Abdominal:  ?   General: There is no distension.  ?   Palpations: Abdomen is soft.  ?Musculoskeletal:     ?   General: Normal range of motion.  ?   Cervical back: Normal range of motion. Tenderness present.  ?Skin: ?   General: Skin is warm and dry.  ?Neurological:  ?   General: No focal deficit present.  ?   Mental Status: He is alert.  ?   Comments: Strength, sensation, reflexes are symmetric in both upper extremities with no focal deficits  ?Psychiatric:     ?   Mood and Affect: Mood normal.     ?   Behavior: Behavior normal.  ? ? ? ?UC Treatments / Results  ?Labs ?(all labs ordered are listed, but only abnormal results are displayed) ?Labs Reviewed - No data to display ? ?EKG ? ? ?Radiology ?No results  found. ? ?Procedures ?Procedures (including critical care time) ? ?Medications Ordered in UC ?Medications - No data to display ? ?Initial Impression / Assessment and Plan / UC Course  ?I have reviewed the triage vital signs and the nursing notes. ? ?Pertinent labs & imaging results that were available during my care of the patient were reviewed by me and considered in my medical decision making (see chart for details). ? ?  ? ?Final Clinical Impressions(s) / UC Diagnoses  ? ?Final diagnoses:  ?Musculoskeletal neck pain  ? ? ? ?Discharge Instructions   ? ?  ?Use ice or heat to painful neck muscles ?Take meloxicam once a day.  This is an anti-inflammatory pain medicine.  Do not take ibuprofen while on meloxicam ?Take Flexeril as needed as muscle relaxer.  This is useful at bedtime.  It may cause drowsiness during the day.  You may take 2 or 3 times a day ?Gentle stretching as advised.  Avoid heavy lifting. ?Return if not improving in a few days, here or to a sports medicine provider ? ? ?ED Prescriptions   ? ? Medication Sig Dispense Auth. Provider  ? meloxicam (MOBIC) 15 MG tablet Take 1 tablet (15 mg total) by mouth daily. 14 tablet Eustace Moore, MD  ? cyclobenzaprine (FLEXERIL) 10 MG tablet Take 1 tablet (10 mg total) by mouth 2 (two) times daily as needed for muscle spasms. 20 tablet Eustace Moore, MD  ? ?  ? ?PDMP not reviewed this encounter. ?  ?Eustace Moore, MD ?08/20/21 1033 ? ?

## 2021-08-20 NOTE — ED Triage Notes (Signed)
Pt c/o left sided neck and shoulder pain for the last 3-4 days. Denies injury or strenuous activity. States pain is constant but worse with certain movements and in the am.  ?
